# Patient Record
Sex: Male | Born: 1985 | Race: White | Hispanic: No | Marital: Married | Smoking: Current some day smoker
Health system: Southern US, Community
[De-identification: ages and names within clinical notes are randomized; demographics above are authoritative.]

## PROBLEM LIST (undated history)

## (undated) HISTORY — PX: MASTECTOMY: SHX3

---

## 2004-10-28 ENCOUNTER — Emergency Department (HOSPITAL_COMMUNITY): Admission: EM | Admit: 2004-10-28 | Discharge: 2004-10-29 | Payer: Self-pay | Admitting: Emergency Medicine

## 2010-02-20 ENCOUNTER — Encounter: Admission: RE | Admit: 2010-02-20 | Discharge: 2010-02-20 | Payer: Self-pay | Admitting: Occupational Medicine

## 2015-07-15 ENCOUNTER — Ambulatory Visit
Admission: RE | Admit: 2015-07-15 | Discharge: 2015-07-15 | Disposition: A | Discharge status: Home / Self Care | Payer: No Typology Code available for payment source | Source: Ambulatory Visit | Attending: Occupational Medicine | Admitting: Occupational Medicine

## 2015-07-15 ENCOUNTER — Other Ambulatory Visit: Payer: Self-pay | Admitting: Occupational Medicine

## 2015-07-15 DIAGNOSIS — Z021 Encounter for pre-employment examination: Secondary | ICD-10-CM

## 2018-06-14 ENCOUNTER — Emergency Department
Admission: EM | Admit: 2018-06-14 | Discharge: 2018-06-14 | Disposition: A | Discharge status: Home / Self Care | Payer: 59 | Attending: Emergency Medicine | Admitting: Emergency Medicine

## 2018-06-14 ENCOUNTER — Other Ambulatory Visit: Payer: Self-pay

## 2018-06-14 ENCOUNTER — Emergency Department: Payer: 59

## 2018-06-14 ENCOUNTER — Encounter: Payer: Self-pay | Admitting: Emergency Medicine

## 2018-06-14 DIAGNOSIS — R1013 Epigastric pain: Secondary | ICD-10-CM | POA: Diagnosis present

## 2018-06-14 DIAGNOSIS — R8271 Bacteriuria: Secondary | ICD-10-CM | POA: Insufficient documentation

## 2018-06-14 DIAGNOSIS — K824 Cholesterolosis of gallbladder: Secondary | ICD-10-CM | POA: Insufficient documentation

## 2018-06-14 DIAGNOSIS — R112 Nausea with vomiting, unspecified: Secondary | ICD-10-CM | POA: Diagnosis not present

## 2018-06-14 DIAGNOSIS — R1010 Upper abdominal pain, unspecified: Secondary | ICD-10-CM

## 2018-06-14 DIAGNOSIS — R1011 Right upper quadrant pain: Secondary | ICD-10-CM

## 2018-06-14 LAB — COMPREHENSIVE METABOLIC PANEL
ALT: 17 U/L (ref 0–44)
AST: 20 U/L (ref 15–41)
Albumin: 4.2 g/dL (ref 3.5–5.0)
Alkaline Phosphatase: 43 U/L (ref 38–126)
Anion gap: 8 (ref 5–15)
BUN: 14 mg/dL (ref 6–20)
CHLORIDE: 103 mmol/L (ref 98–111)
CO2: 29 mmol/L (ref 22–32)
CREATININE: 0.93 mg/dL (ref 0.44–1.00)
Calcium: 9.3 mg/dL (ref 8.9–10.3)
GFR calc Af Amer: >60 mL/min (ref >60)
Glucose, Bld: 114 mg/dL — ABNORMAL HIGH (ref 70–99)
POTASSIUM: 3.8 mmol/L (ref 3.5–5.1)
Sodium: 140 mmol/L (ref 135–145)
Total Bilirubin: 1.2 mg/dL (ref 0.3–1.2)
Total Protein: 7.1 g/dL (ref 6.5–8.1)

## 2018-06-14 LAB — CBC
HCT: 42.4 % (ref 36.0–46.0)
Hemoglobin: 14.1 g/dL (ref 12.0–15.0)
MCH: 27.8 pg (ref 26.0–34.0)
MCHC: 33.3 g/dL (ref 30.0–36.0)
MCV: 83.6 fL (ref 80.0–100.0)
NRBC: 0 % (ref 0.0–0.2)
PLATELETS: 241 10*3/uL (ref 150–400)
RBC: 5.07 MIL/uL (ref 3.87–5.11)
RDW: 12.5 % (ref 11.5–15.5)
WBC: 5.7 10*3/uL (ref 4.0–10.5)

## 2018-06-14 LAB — URINALYSIS, COMPLETE (UACMP) WITH MICROSCOPIC
Bilirubin Urine: NEGATIVE
Glucose, UA: NEGATIVE mg/dL
Hgb urine dipstick: NEGATIVE
KETONES UR: 5 mg/dL — AB
LEUKOCYTES UA: NEGATIVE
Nitrite: NEGATIVE
PROTEIN: NEGATIVE mg/dL
Specific Gravity, Urine: 1.012 (ref 1.005–1.030)
pH: 5 (ref 5.0–8.0)

## 2018-06-14 LAB — POCT PREGNANCY, URINE: Preg Test, Ur: NEGATIVE

## 2018-06-14 LAB — INFLUENZA PANEL BY PCR (TYPE A & B)
INFLAPCR: NEGATIVE
INFLBPCR: NEGATIVE

## 2018-06-14 LAB — LIPASE, BLOOD: LIPASE: 39 U/L (ref 11–51)

## 2018-06-14 MED ORDER — ONDANSETRON 4 MG PO TBDP: 4.0000 mg | ORAL_TABLET | Freq: Three times a day (TID) | ORAL | 0 refills | Status: DC | PRN

## 2018-06-14 MED ORDER — SODIUM CHLORIDE 0.9 % IV BOLUS
1000.0000 mL | Freq: Once | INTRAVENOUS | Status: AC
  Administered 2018-06-14: 1000 mL via INTRAVENOUS

## 2018-06-14 MED ORDER — ONDANSETRON HCL 4 MG/2ML IJ SOLN
4.0000 mg | Freq: Once | INTRAMUSCULAR | Status: AC
  Administered 2018-06-14: 4 mg via INTRAVENOUS
  Filled 2018-06-14: qty 2

## 2018-06-14 MED ORDER — CEPHALEXIN 500 MG PO CAPS: 500.0000 mg | ORAL_CAPSULE | Freq: Four times a day (QID) | ORAL | 0 refills | Status: AC

## 2018-06-14 NOTE — ED Provider Notes (Signed)
[facility] Emergency Department Provider Note  ____________________________________________  Time seen: Approximately 11:45 AM  I have reviewed the triage vital signs and the nursing notes.   HISTORY  Chief Complaint Emesis    HPI Willie Mckinney is a 32 y.o. male, otherwise healthy, presenting with epigastric and right upper quadrant pain, nausea and vomiting.  The patient reports that this morning she woke up with a dull throbbing ache in her epigastrium, which persisted and got worse throughout the day.  She then began to have episodes of nausea and vomiting.  Vomiting would improve her pain briefly, but then it would come back.  Her last bowel movement was 2 days ago which was normal.  She was first seen at urgent care and received Zofran Hemoccult and then received another dose here and her nausea has completely resolved.  At this time, she is not having any pain.  She did not eat any fried or fatty foods today.  She denies any fevers or chills, sick contacts.  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  She is currently menstruating.  History reviewed. No pertinent past medical history.  There are no active problems to display for this patient.   History reviewed. No pertinent surgical history.    Allergies Sulfa antibiotics  No family history on file.  Social History Social History   Tobacco Use  . Smoking status: Never Smoker  . Smokeless tobacco: Never Used  Substance Use Topics  . Alcohol use: Not on file  . Drug use: Not on file    Review of Systems Constitutional: No fever/chills.  No lightheadedness or syncope. Eyes: No visual changes. ENT: No sore throat. No congestion or rhinorrhea. Cardiovascular: Denies chest pain. Denies palpitations. Respiratory: Denies shortness of breath.  No cough. Gastrointestinal: Positive epigastric and right upper quadrant abdominal pain.  Positive  nausea, positive vomiting.  No diarrhea.  No constipation. Genitourinary: Negative for dysuria.  No urinary frequency.  Current menstruation peer Musculoskeletal: Negative for back pain. Skin: Negative for rash. Neurological: Negative for headaches. No focal numbness, tingling or weakness.     ____________________________________________   PHYSICAL EXAM:  VITAL SIGNS: ED Triage Vitals [06/14/18 0921]  Enc Vitals Group     BP 120/84     Pulse Rate 65     Resp 18     Temp 98.2 F (36.8 C)     Temp Source Oral     SpO2 100 %     Weight 135 lb (61.2 kg)     Height 5\' 4"  (1.626 m)     Head Circumference      Peak Flow      Pain Score 5     Pain Loc      Pain Edu?      Excl. in GC?     Constitutional: Alert and oriented.Answers questions appropriately. Eyes: Conjunctivae are normal.  EOMI. No scleral icterus. Head: Atraumatic. Nose: No congestion/rhinnorhea. Mouth/Throat: Mucous membranes are moist.  Neck: No stridor.  Supple.   Cardiovascular: Normal rate, regular rhythm. No murmurs, rubs or gallops.  Respiratory: Normal respiratory effort.  No accessory muscle use or retractions. Lungs CTAB.  No wheezes, rales or ronchi. Gastrointestinal: Soft, and nondistended.  Mild tenderness to palpation greater in the epigastrium than the right upper quadrant.  Negative Murphy sign.  No guarding or rebound.  No peritoneal signs. Musculoskeletal: No LE edema.  Neurologic:  A&Ox3.  Speech  is clear.  Face and smile are symmetric.  EOMI.  Moves all extremities well. Skin:  Skin is warm, dry and intact. No rash noted. Psychiatric: Mood and affect are normal. Speech and behavior are normal.  Normal judgement.  ____________________________________________   LABS (all labs ordered are listed, but only abnormal results are displayed)  Labs Reviewed  COMPREHENSIVE METABOLIC PANEL - Abnormal; Notable for the following components:      Result Value   Glucose, Bld 114 (*)    All other  components within normal limits  URINALYSIS, COMPLETE (UACMP) WITH MICROSCOPIC - Abnormal; Notable for the following components:   Color, Urine YELLOW (*)    APPearance CLEAR (*)    Ketones, ur 5 (*)    Bacteria, UA RARE (*)    All other components within normal limits  URINE CULTURE  CBC  LIPASE, BLOOD  INFLUENZA PANEL BY PCR (TYPE A & B)  POC URINE PREG, ED  POCT PREGNANCY, URINE   ____________________________________________  EKG  ED ECG REPORT I, Anne-Caroline Sharma Covert, the attending physician, personally viewed and interpreted this ECG.   Date: 06/14/2018  EKG Time: 1158  Rate: 78  Rhythm: normal sinus rhythm  Axis: normal  Intervals:none  ST&T Change: no STEMI  ____________________________________________  RADIOLOGY  US Abdomen Limited Ruq  Result Date: 06/14/2018 CLINICAL DATA:  Right upper quadrant pain and vomiting for the past day EXAM: ULTRASOUND ABDOMEN LIMITED RIGHT UPPER QUADRANT COMPARISON:  None. FINDINGS: Gallbladder: The gallbladder is adequately distended. There is an echogenic nonshadowing non mobile 3.6 mm focus adherent to the gallbladder mucosal surface. There is no gallbladder wall thickening, pericholecystic fluid, or positive sonographic Murphy's sign. Common bile duct: Diameter: 2.8 mm Liver: No focal lesion identified. Within normal limits in parenchymal echogenicity. Portal vein is patent on color Doppler imaging with normal direction of blood flow towards the liver. IMPRESSION: Probable 3.6 mm gallbladder polyp. No sonographic evidence of stones or acute cholecystitis. If there are clinical concerns of chronic gallbladder dysfunction, a nuclear medicine hepatobiliary scan with gallbladder ejection fraction determination may be useful. Normal liver and common bile duct. Electronically Signed   By: David  [place] M.D.   On: 06/14/2018 13:03    ____________________________________________   PROCEDURES  Procedure(s) performed:  None  Procedures  Critical Care performed: No ____________________________________________   INITIAL IMPRESSION / ASSESSMENT AND PLAN / ED COURSE  Pertinent labs & imaging results that were available during my care of the patient were reviewed by me and considered in my medical decision making (see chart for details).  32 y.o. male, otherwise healthy, presenting with epigastric and right upper quadrant pain associated with nausea and vomiting.  Overall, the patient is hemodynamically stable and afebrile.  We will evaluate her for gallbladder pathology.  A viral or foodborne illness is also possible.  We will rule out influenza.,  The patient's pregnancy test is negative and she has a normal white blood cell count 5.7.  Her electrolytes and hepatic function panel are reassuring.  Her lipase is also negative.  She has rare bacteria in her urine without any other signs of infection; given no dysuria or other urinary symptoms, a culture will be sent but no antibiotics are indicated at this time.  ----------------------------------------- 1:50 PM on 06/14/2018 -----------------------------------------  The patient's work-up in the emergency department has been reassuring.  Her influenza testing is negative.  She is able to keep down clear liquids without any difficulty.  Her pain has resolved.  Her ultrasound shows a  gallbladder polyp, but no evidence of acute gallbladder pathology.  I have given her instructions to follow-up with a general surgeon to evaluate her for possible symptomatic gallbladder polyp.  At this time, the patient is safe for discharge home.  She will be given a prescription for Zofran to use for nausea and vomiting, and Keflex to use if she develops signs or symptoms of UTI.  Follow-up instructions as well as return precautions were discussed.  ____________________________________________  FINAL CLINICAL IMPRESSION(S) / ED DIAGNOSES  Final diagnoses:  Right upper quadrant  pain  Bacteriuria  Gallbladder polyp  Upper abdominal pain  Non-intractable vomiting with nausea, unspecified vomiting type         NEW MEDICATIONS STARTED DURING THIS VISIT:  New Prescriptions   CEPHALEXIN (KEFLEX) 500 MG CAPSULE    Take 1 capsule (500 mg total) by mouth 4 (four) times daily for 3 days.   ONDANSETRON (ZOFRAN ODT) 4 MG DISINTEGRATING TABLET    Take 1 tablet (4 mg total) by mouth every 8 (eight) hours as needed for nausea or vomiting.      Rockne MenghiniNorman, Anne-Caroline, MD 06/14/18 1352

## 2018-06-14 NOTE — ED Triage Notes (Signed)
PT arrived with complains of nausea and vomiting since yesterday am. Pt reports 3-4 episodes of emesis in the last 24 hours.

## 2018-06-14 NOTE — ED Notes (Signed)
Patient had EKG leads placed on her.

## 2018-06-14 NOTE — Discharge Instructions (Addendum)
They had bacteria in your urine without any other evidence of UTI.  I sent your urine for culture and your primary care physician can follow-up the results.  We will be discharged home with a prescription for Keflex for 3 days; you only need to take this medication if you develop pain or burning with urination or urinating more than usual.  Today, you were found to have a gallbladder polyp.  Sometimes this can cause symptoms, including pain and vomiting.  Most of the time gallbladder polyps do not cause symptoms.  Please make an appointment to follow-up with a general surgeon for further evaluation of your gallbladder polyp.  Please take a clear liquid diet for the next 12 to 24 hours, then advance to a bland diet as tolerated.  Zofran is for nausea and vomiting.  Return to the emergency department if you develop severe pain, lightheadedness or fainting, fever or chills, inability to keep down fluids, or any other symptoms concerning to you.

## 2018-06-14 NOTE — ED Notes (Signed)
FIRST NURSE NOTE:  Pt sent from [facility] for abdominal pain and vomiting since yesterday.

## 2018-06-14 NOTE — ED Notes (Signed)
Patient transported to Ultrasound 

## 2021-06-25 ENCOUNTER — Emergency Department (HOSPITAL_COMMUNITY): Payer: 59

## 2021-06-25 ENCOUNTER — Emergency Department (HOSPITAL_COMMUNITY)
Admission: EM | Admit: 2021-06-25 | Discharge: 2021-06-25 | Disposition: A | Discharge status: Home / Self Care | Payer: 59 | Attending: Emergency Medicine | Admitting: Emergency Medicine

## 2021-06-25 ENCOUNTER — Encounter (HOSPITAL_COMMUNITY): Payer: Self-pay | Admitting: Emergency Medicine

## 2021-06-25 ENCOUNTER — Other Ambulatory Visit: Payer: Self-pay

## 2021-06-25 DIAGNOSIS — I4891 Unspecified atrial fibrillation: Secondary | ICD-10-CM | POA: Diagnosis not present

## 2021-06-25 DIAGNOSIS — R002 Palpitations: Secondary | ICD-10-CM | POA: Diagnosis present

## 2021-06-25 LAB — CBC
HCT: 48.2 % (ref 39.0–52.0)
Hemoglobin: 16.1 g/dL (ref 13.0–17.0)
MCH: 27.3 pg (ref 26.0–34.0)
MCHC: 33.4 g/dL (ref 30.0–36.0)
MCV: 81.8 fL (ref 80.0–100.0)
Platelets: 304 10*3/uL (ref 150–400)
RBC: 5.89 MIL/uL — ABNORMAL HIGH (ref 4.22–5.81)
RDW: 12.1 % (ref 11.5–15.5)
WBC: 13.1 10*3/uL — ABNORMAL HIGH (ref 4.0–10.5)
nRBC: 0 % (ref 0.0–0.2)

## 2021-06-25 LAB — BASIC METABOLIC PANEL
Anion gap: 7 (ref 5–15)
BUN: 18 mg/dL (ref 6–20)
CO2: 27 mmol/L (ref 22–32)
Calcium: 9.4 mg/dL (ref 8.9–10.3)
Chloride: 103 mmol/L (ref 98–111)
Creatinine, Ser: 1.31 mg/dL — ABNORMAL HIGH (ref 0.61–1.24)
GFR, Estimated: >60 mL/min (ref >60)
Glucose, Bld: 121 mg/dL — ABNORMAL HIGH (ref 70–99)
Potassium: 3.6 mmol/L (ref 3.5–5.1)
Sodium: 137 mmol/L (ref 135–145)

## 2021-06-25 LAB — TSH: TSH: 1.765 u[IU]/mL (ref 0.350–4.500)

## 2021-06-25 LAB — I-STAT BETA HCG BLOOD, ED (MC, WL, AP ONLY): I-stat hCG, quantitative: <5 m[IU]/mL (ref <5)

## 2021-06-25 LAB — MAGNESIUM: Magnesium: 2.4 mg/dL (ref 1.7–2.4)

## 2021-06-25 NOTE — ED Provider Notes (Signed)
Emergency Medicine Provider Triage Evaluation Note  Willie Mckinney , a 35 y.o. adult  was evaluated in triage.  Pt complains of palpitations. Had ekg pta with ems/fire dept that showed afib with rvr. No hx same, no cp.  Review of Systems  Positive: palpitations Negative: Cp  Physical Exam  BP (!) 157/98 (BP Location: Left Arm)   Pulse (!) 101   Temp 98 F (36.7 C) (Oral)   Ht 5\' 4"  (1.626 m)   Wt 77.1 kg   SpO2 96%   BMI 29.18 kg/m  Gen:   Awake, no distress   Resp:  Normal effort  MSK:   Moves extremities without difficulty  Other:  Irregularly irregular rhythm  Medical Decision Making  Medically screening exam initiated at 3:45 PM.  Appropriate orders placed.  Jasmond River was informed that the remainder of the evaluation will be completed by another provider, this initial triage assessment does not replace that evaluation, and the importance of remaining in the ED until their evaluation is complete.     Maurice March, PA-C 06/25/21 1546    06/27/21, MD 06/26/21 [phone]

## 2021-06-25 NOTE — ED Provider Notes (Signed)
[facility] EMERGENCY DEPARTMENT Provider Note   CSN: [id number] Arrival date & time: 06/25/21  1529     History Chief Complaint  Patient presents with   Irregular Heart Beat    Willie Mckinney is a 35 y.o. adult.  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, 8 calls this AM Began after connecting hose HR still felt elevated for about 3 hours Crewmates took BP and was slightly elevated, HR irregular and slightly elevated HR was elevated to 120s, was saying afib with paramedic No shortness of breath or chest pain, just palpitations No dizziness or lightheadedness, felt just sort of off No history of palpitations before No nausea, vomiting, cough, fever, urinary symptoms No hx of DVT/PE, no long trips/recent surgeries Taking testosterone No other medications Caffeine intake, one margarita last night No smoking, no other drugs, occ etoh Hx of early heart disease, mom died at 76   History reviewed. No pertinent past medical history.  There are no problems to display for this patient.   Past Surgical History:  Procedure Laterality Date   MASTECTOMY       OB History   No obstetric history on file.     No family history on file.  Social History   Tobacco Use   Smoking status: Never   Smokeless tobacco: Never  Substance Use Topics   Alcohol use: Yes   Drug use: Never    Home Medications Prior to Admission medications   Medication Sig Start Date End Date Taking? Authorizing Provider  ondansetron (ZOFRAN ODT) 4 MG disintegrating tablet Take 1 tablet (4 mg total) by mouth every 8 (eight) hours as needed for nausea or vomiting. 06/14/18   Eula Listen, MD    Allergies    Sulfa antibiotics  Review of Systems   Review of  Systems  Constitutional:  Negative for fever.  HENT:  Negative for sore throat.   Eyes:  Negative for visual disturbance.  Respiratory:  Negative for shortness of breath.   Cardiovascular:  Positive for palpitations. Negative for chest pain.  Gastrointestinal:  Negative for abdominal pain, diarrhea, nausea and vomiting.  Genitourinary:  Negative for difficulty urinating.  Musculoskeletal:  Negative for back pain and neck stiffness.  Skin:  Negative for rash.  Neurological:  Negative for syncope and headaches.   Physical Exam Updated Vital Signs BP 132/71   Pulse 74   Temp 98.2 F (36.8 C) (Oral)   Resp 19   Ht 5\' 4"  (1.626 m)   Wt 77.1 kg   LMP  (LMP Unknown) Comment: transgender last menses 2 years ago  SpO2 96%   BMI 29.18 kg/m   Physical Exam Vitals and nursing note reviewed.  Constitutional:      General: He is not in acute distress.    Appearance: He is well-developed. He is not diaphoretic.  HENT:     Head: Normocephalic and atraumatic.  Eyes:     Conjunctiva/sclera: Conjunctivae normal.  Cardiovascular:     Rate and Rhythm: Normal rate and regular rhythm.     Heart sounds: Normal heart sounds. No murmur heard.   No friction rub. No gallop.  Pulmonary:     Effort: Pulmonary effort is normal. No respiratory distress.     Breath sounds: Normal breath sounds. No wheezing  or rales.  Abdominal:     General: There is no distension.     Palpations: Abdomen is soft.     Tenderness: There is no abdominal tenderness. There is no guarding.  Musculoskeletal:        General: No tenderness.     Cervical back: Normal range of motion.  Skin:    General: Skin is warm and dry.     Findings: No erythema or rash.  Neurological:     Mental Status: He is alert and oriented to person, place, and time.    ED Results / Procedures / Treatments   Labs (all labs ordered are listed, but only abnormal results are displayed) Labs Reviewed  BASIC METABOLIC PANEL - Abnormal; Notable  for the following components:      Result Value   Glucose, Bld 121 (*)    Creatinine, Ser 1.31 (*)    All other components within normal limits  CBC - Abnormal; Notable for the following components:   WBC 13.1 (*)    RBC 5.89 (*)    All other components within normal limits  MAGNESIUM  TSH  I-STAT BETA HCG BLOOD, ED ([facility], [facility], [facility] ONLY)    EKG EKG Interpretation  Date/Time:  Wednesday June 25 2021 15:42:27 EST Ventricular Rate:  111 PR Interval:    QRS Duration: 86 QT Interval:  264 QTC Calculation: 359 R Axis:   97 Text Interpretation: Atrial fibrillation with rapid ventricular response Rightward axis Biventricular hypertrophy Nonspecific ST and T wave abnormality Abnormal ECG Since prior ECG< pt is now in atrial fibrillation, has peaked TW anteriorly Confirmed by Gareth Morgan [phone]) on 06/25/2021 8:58:03 PM  Radiology DG Chest 2 View  Result Date: 06/25/2021 CLINICAL DATA:  Palpitations EXAM: CHEST - 2 VIEW COMPARISON:  07/15/2015 FINDINGS: Heart size is normal. Mediastinal shadows are normal. The lungs are clear. No bronchial thickening. No infiltrate, mass, effusion or collapse. Pulmonary vascularity is normal. No bony abnormality. IMPRESSION: Normal chest Electronically Signed   By: Nelson Chimes M.D.   On: 06/25/2021 16:09    Procedures Procedures   Medications Ordered in ED Medications - No data to display  ED Course  I have reviewed the triage vital signs and the nursing notes.  Pertinent labs & imaging results that were available during my care of the patient were reviewed by me and considered in my medical decision making (see chart for details).    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  Initial ECG in ECG consistent with atrial  fibrillation with elevated rate. While in the waiting room, spontaneously converted to sinus rhythm and felt palpitations reside.    No sign of anemia, significant electrolyte abnormality, thyroid abnormality, pregnancy test negative.  No chest pain or dyspnea, no tachycardia after conversion from atrial fibrillation, no hypoxia have low clinical suspicion for PE or ACS.  No recent illness.  Unclear if combination of factors including exertion/stress/caffeine led to episode today.  Now that he is converted to normal rhythm, his HR is in 60s-70s.  He was symptomatic with atrial fibrillation and at this point has had single brief episode. CHADSVasc 0-1, and initiating blood thinner would mean a career change, feel  it is reasonable to hold on anticoagulation at this time, follow up with Cardiology, avoid triggers and monitor symptoms.Patient discharged in stable condition with understanding of reasons to return.    Final Clinical Impression(s) / ED Diagnoses Final diagnoses:  Atrial fibrillation with RVR [facility])    Rx / DC Orders ED Discharge Orders          Ordered    Amb referral to AFIB Clinic        06/25/21 1547             Alvira Monday, MD 06/26/21 1558

## 2021-06-25 NOTE — ED Triage Notes (Signed)
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 Reports palpitations denies chest pain or shortness of breath.EKG taken showed SVT more than A.fib. a/o x4 NAD. Uses testosterone-gender reassignment.

## 2021-07-01 ENCOUNTER — Encounter (HOSPITAL_COMMUNITY): Payer: Self-pay | Admitting: Physician Assistant

## 2021-07-01 ENCOUNTER — Other Ambulatory Visit: Payer: Self-pay

## 2021-07-01 ENCOUNTER — Ambulatory Visit (HOSPITAL_COMMUNITY)
Admission: RE | Admit: 2021-07-01 | Discharge: 2021-07-01 | Disposition: A | Discharge status: Home / Self Care | Payer: 59 | Source: Ambulatory Visit | Attending: Physician Assistant | Admitting: Physician Assistant

## 2021-07-01 VITALS — BP 138/74 | HR 82 | Ht 64.0 in | Wt 174.6 lb

## 2021-07-01 DIAGNOSIS — R9431 Abnormal electrocardiogram [ECG] [EKG]: Secondary | ICD-10-CM | POA: Diagnosis not present

## 2021-07-01 DIAGNOSIS — I480 Paroxysmal atrial fibrillation: Secondary | ICD-10-CM | POA: Insufficient documentation

## 2021-07-01 NOTE — Progress Notes (Signed)
Primary Care Physician: Sherren Mocha, MD Primary Cardiologist: none Primary Electrophysiologist: none Referring Physician: [facility]   Willie Mckinney is a 35 y.o. male (transgender/male on testosterone) with a history of new onset atrial fibrillation who presents for consultation in the [facility]. The patient was initially diagnosed with atrial fibrillation 06/25/21 after presenting to the ED with symptoms of palpitations. 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 His palpitations began after connecting hose. HR still felt elevated for about 3 hours. His crewmates took BP and ECG which showed afib with RVR. ECG at the ED also showed afib with HR 111 bpm. He converted to SR in the waiting room without intervention. Patient has a CHADS2VASC score of 0. He denies significant snoring and only occasional alcohol use.   Today, he denies symptoms of palpitations, chest pain, shortness of breath, orthopnea, PND, lower extremity edema, dizziness, presyncope, syncope, snoring, daytime somnolence, bleeding, or neurologic sequela. The patient is tolerating medications without difficulties and is otherwise without complaint today.    Atrial Fibrillation Risk Factors:  he does not have symptoms or diagnosis of sleep apnea. he does not have a history of rheumatic fever. he does not have a history of alcohol use. The patient does not have a history of early familial atrial fibrillation or other arrhythmias.  he has a BMI of Body mass index is 29.97 kg/m.Marland Kitchen Filed Weights   07/01/21 1445  Weight: 79.2 kg    No family history on file.   Atrial Fibrillation Management history:  Previous antiarrhythmic drugs: none Previous cardioversions: none Previous ablations: none CHADS2VASC score: 0 Anticoagulation history: none   No past medical history on file. Past Surgical History:  Procedure Laterality Date   MASTECTOMY       Current Outpatient Medications  Medication Sig Dispense Refill   testosterone cypionate (DEPOTESTOSTERONE CYPIONATE) 200 MG/ML injection SMARTSIG:0.4 Milliliter(s) SUB-Q Once a Week     No current facility-administered medications for this encounter.    Allergies  Allergen Reactions   Sulfa Antibiotics Hives    hives    Social History   Socioeconomic History   Marital status: Married    Spouse name: Not on file   Number of children: Not on file   Years of education: Not on file   Highest education level: Not on file  Occupational History   Not on file  Tobacco Use   Smoking status: Some Days    Types: Cigars   Smokeless tobacco: Never   Tobacco comments:    Few times a year with Cigars 07/01/2021  Substance and Sexual Activity   Alcohol use: Yes    Alcohol/week: 1.0 - 2.0 standard drink    Types: 1 - 2 Shots of liquor per week    Comment: once or twice weekly 07/01/2021   Drug use: Never   Sexual activity: Not on file  Other Topics Concern   Not on file  Social History Narrative   Not on file   Social Determinants of Health   Financial Resource Strain: Not on file  Food Insecurity: Not on file  Transportation Needs: Not on file  Physical Activity: Not on file  Stress: Not on file  Social Connections: Not on file  Intimate Partner Violence: Not on file     ROS- All systems are reviewed and negative except as per the HPI above.  Physical Exam: Vitals:   07/01/21 1445  BP: 138/74  Pulse: 82  Weight: 79.2 kg  Height: 5\' 4"  (1.626 m)    GEN- The patient is a well appearing male, alert and oriented x 3 today.   Head- normocephalic, atraumatic Eyes-  Sclera clear, conjunctiva pink Ears- hearing intact Oropharynx- clear Neck- supple  Lungs- Clear to ausculation bilaterally, normal work of breathing Heart- Regular rate and rhythm, no murmurs, rubs or gallops  GI- soft, NT, ND, + BS Extremities- no clubbing, cyanosis, or edema MS- no significant  deformity or atrophy Skin- no rash or lesion Psych- euthymic mood, full affect Neuro- strength and sensation are intact  Wt Readings from Last 3 Encounters:  07/01/21 79.2 kg  06/25/21 77.1 kg  06/14/18 61.2 kg    EKG today demonstrates  SR, LVH, short PR Vent. rate 82 BPM PR interval 112 ms QRS duration 94 ms QT/QTcB 348/406 ms   Epic records are reviewed at length today  CHA2DS2-VASc Score = 1 The patient's score is based upon: CHF History: 0 HTN History: 0 Diabetes History: 0 Stroke History: 0 Vascular Disease History: 0 Age Score: 0 Gender Score: 1 (transgender male)   ASSESSMENT AND PLAN: 1. Paroxysmal Atrial Fibrillation (ICD10:  I48.0) The patient's CHA2DS2-VASc score is 1 General education about afib provided and questions answered. We also discussed his stroke risk and the risks and benefits of anticoagulation. Anticoagulation not indicated at this time with low CV score.  Check echocardiogram Will refer him to EP given short PR noted on ECG today.  D/w Dr Curt Bears, [place] to return to work.   Follow up with EP to establish care.    [facility] [address] [place], [place] [address] [phone] 07/01/2021 3:59 PM

## 2021-08-05 ENCOUNTER — Ambulatory Visit (HOSPITAL_COMMUNITY): Payer: 59

## 2021-08-13 ENCOUNTER — Institutional Professional Consult (permissible substitution): Payer: 59 | Admitting: Cardiology

## 2022-09-17 IMAGING — DX DG CHEST 2V
2 series · 2 of 2 positions shown · non-contrast
Comparison: 07/15/2015

CLINICAL DATA: Palpitations

EXAM:
CHEST - 2 VIEW

[chest pa]
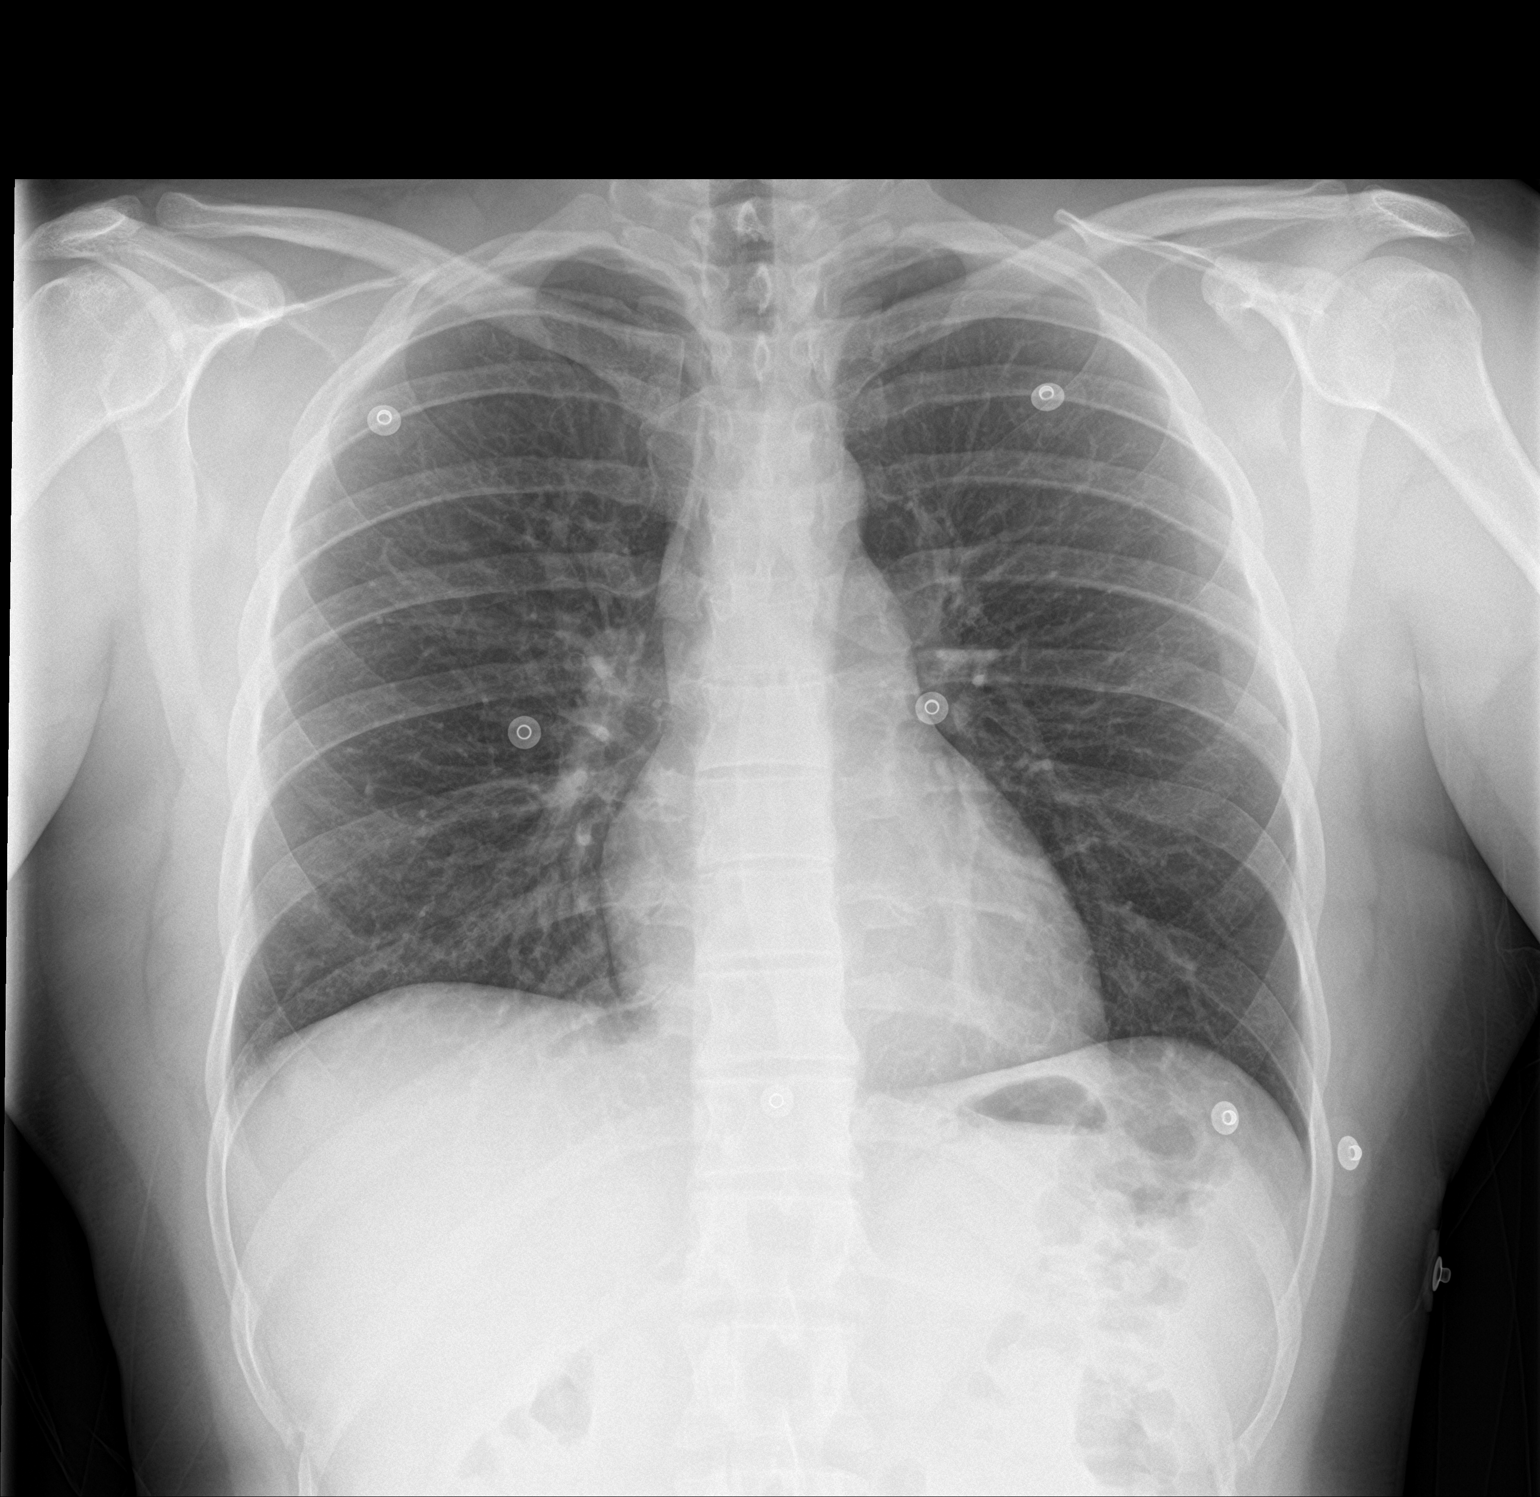

[chest lat]
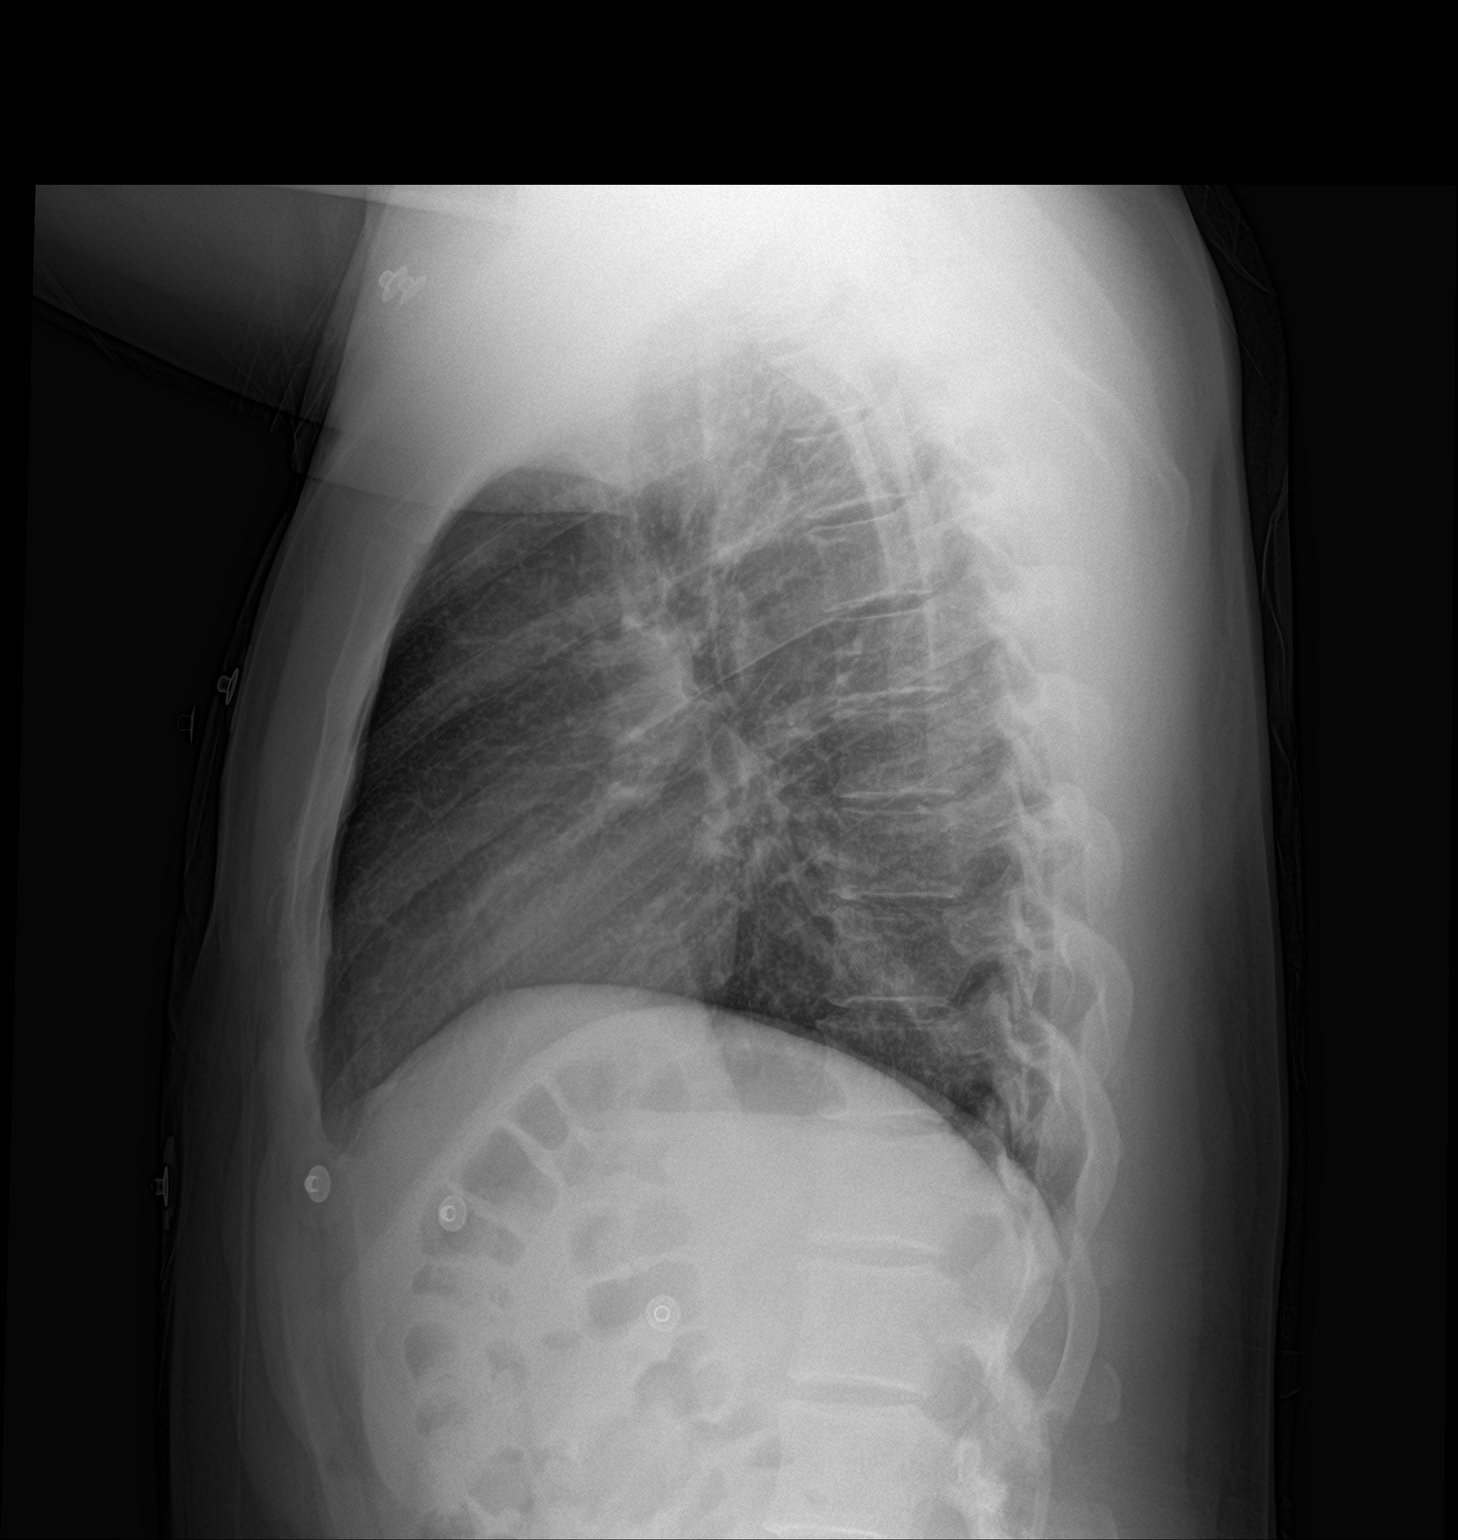

[2 of 2 positions shown; findings below may reference images not displayed]

FINDINGS: Heart size is normal. Mediastinal shadows are normal. The lungs are
clear. No bronchial thickening. No infiltrate, mass, effusion or
collapse. Pulmonary vascularity is normal. No bony abnormality.
IMPRESSION: Normal chest
